# Patient Record
Sex: Male | Born: 1960 | Race: Black or African American | Hispanic: No | Marital: Married | Smoking: Current every day smoker
Health system: Southern US, Community
[De-identification: ages and names within clinical notes are randomized; demographics above are authoritative.]

---

## 2007-03-26 ENCOUNTER — Emergency Department (HOSPITAL_COMMUNITY): Admission: EM | Admit: 2007-03-26 | Discharge: 2007-03-26 | Payer: Self-pay | Admitting: Emergency Medicine

## 2020-03-25 ENCOUNTER — Other Ambulatory Visit: Payer: Self-pay | Admitting: Family Medicine

## 2020-03-25 ENCOUNTER — Ambulatory Visit: Payer: Self-pay

## 2020-03-25 ENCOUNTER — Other Ambulatory Visit: Payer: Self-pay

## 2020-03-25 ENCOUNTER — Ambulatory Visit (HOSPITAL_COMMUNITY)
Admission: EM | Admit: 2020-03-25 | Discharge: 2020-03-25 | Disposition: A | Discharge status: Home / Self Care | Payer: Self-pay

## 2020-03-25 DIAGNOSIS — M25571 Pain in right ankle and joints of right foot: Secondary | ICD-10-CM

## 2020-03-25 NOTE — ED Notes (Signed)
Pt was here for workers comp; pt was sent to occupational health.

## 2021-08-26 ENCOUNTER — Other Ambulatory Visit: Payer: Self-pay

## 2021-08-26 ENCOUNTER — Ambulatory Visit (HOSPITAL_COMMUNITY)
Admission: EM | Admit: 2021-08-26 | Discharge: 2021-08-26 | Disposition: A | Discharge status: Home / Self Care | Payer: PRIVATE HEALTH INSURANCE | Attending: Emergency Medicine | Admitting: Emergency Medicine

## 2021-08-26 ENCOUNTER — Encounter (HOSPITAL_COMMUNITY): Payer: Self-pay | Admitting: Emergency Medicine

## 2021-08-26 ENCOUNTER — Ambulatory Visit (INDEPENDENT_AMBULATORY_CARE_PROVIDER_SITE_OTHER): Payer: PRIVATE HEALTH INSURANCE

## 2021-08-26 ENCOUNTER — Ambulatory Visit (HOSPITAL_COMMUNITY): Payer: PRIVATE HEALTH INSURANCE

## 2021-08-26 DIAGNOSIS — M79671 Pain in right foot: Secondary | ICD-10-CM | POA: Diagnosis not present

## 2021-08-26 MED ORDER — IBUPROFEN 800 MG PO TABS: 800.0000 mg | ORAL_TABLET | Freq: Three times a day (TID) | ORAL | 0 refills | Status: AC

## 2021-08-26 NOTE — ED Triage Notes (Signed)
Pt is present today with right foot pain that started today. Pt states that it is painful to bear weight down on that foot. Pt denies any injury

## 2021-08-26 NOTE — ED Provider Notes (Signed)
[facility]    CSN: [id number] Arrival date & time: 08/26/21  1445      History   Chief Complaint Chief Complaint  Patient presents with   Foot Pain    Right      HPI Scott Roy is a 60 y.o. male.   Patient here for evaluation of right foot pain that started last night.  Reports pain is achy and constant.  Reports pain is worse with movement or when bearing weight.  Does report foot pain and "problems" in the past.  Reports taking Aleve with minimal symptom relief.  Denies any trauma, injury, or other precipitating event.  Denies any fevers, chest pain, shortness of breath, N/V/D, numbness, tingling, weakness, abdominal pain, or headaches.    The history is provided by the patient and a relative. The history is limited by a language barrier.  Foot Pain   History reviewed. No pertinent past medical history.  There are no problems to display for this patient.   History reviewed. No pertinent surgical history.     Home Medications    Prior to Admission medications   Medication Sig Start Date End Date Taking? Authorizing Provider  ibuprofen (ADVIL) 800 MG tablet Take 1 tablet (800 mg total) by mouth 3 (three) times daily. 08/26/21  Yes Ivette Loyal, NP    Family History History reviewed. No pertinent family history.  Social History Social History   Tobacco Use   Smoking status: Every Day    Types: Cigarettes   Smokeless tobacco: Never  Vaping Use   Vaping Use: Never used  Substance Use Topics   Alcohol use: Not Currently   Drug use: Never     Allergies   Patient has no known allergies.   Review of Systems Review of Systems  Musculoskeletal:  Positive for arthralgias and joint swelling.  All other systems reviewed and are negative.   Physical Exam Triage Vital Signs ED Triage Vitals  Enc Vitals Group     BP 08/26/21 1600 (!) 156/92     Pulse Rate 08/26/21 1558 73     Resp 08/26/21 1558 18     Temp 08/26/21 1558 98.2 F (36.8  C)     Temp src --      SpO2 08/26/21 1558 100 %     Weight --      Height --      Head Circumference --      Peak Flow --      Pain Score 08/26/21 1556 10     Pain Loc --      Pain Edu? --      Excl. in GC? --    No data found.  Updated Vital Signs BP (!) 156/92   Pulse 73   Temp 98.2 F (36.8 C)   Resp 18   SpO2 100%   Visual Acuity Right Eye Distance:   Left Eye Distance:   Bilateral Distance:    Right Eye Near:   Left Eye Near:    Bilateral Near:     Physical Exam Vitals and nursing note reviewed.  Constitutional:      General: He is not in acute distress.    Appearance: Normal appearance. He is not ill-appearing, toxic-appearing or diaphoretic.  HENT:     Head: Normocephalic and atraumatic.  Eyes:     Conjunctiva/sclera: Conjunctivae normal.  Cardiovascular:     Rate and Rhythm: Normal rate.     Pulses: Normal pulses.  Pulmonary:  Effort: Pulmonary effort is normal.  Abdominal:     General: Abdomen is flat.  Musculoskeletal:     Cervical back: Normal range of motion.     Right foot: Decreased range of motion (due to pain). Normal capillary refill. Swelling, tenderness and bony tenderness present. No crepitus. Normal pulse.       Feet:  Skin:    General: Skin is warm and dry.  Neurological:     General: No focal deficit present.     Mental Status: He is alert and oriented to person, place, and time.  Psychiatric:        Mood and Affect: Mood normal.     UC Treatments / Results  Labs (all labs ordered are listed, but only abnormal results are displayed) Labs Reviewed - No data to display  EKG   Radiology DG Foot Complete Right  Result Date: 08/26/2021 CLINICAL DATA:  Pain and swelling of the right foot EXAM: RIGHT FOOT COMPLETE - 3+ VIEW COMPARISON:  03/25/2020 ankle radiographs FINDINGS: Chronically fragmented dorsal spurring of the talar head apparent on the lateral projection and not changed from 03/25/2020. Small plantar and  Achilles calcaneal spurs. The fifth toe is flexed during imaging in the fourth toe is mildly flexed during imaging. I do not observe a fracture. There continues to be some mild dorsal spurring at the Lisfranc joint medially. I do not observe a foreign body or abnormal gas in the soft tissues. IMPRESSION: 1. Chronic plantar and Achilles calcaneal spurs. Chronic fragmented spurring of the dorsal navicular. There is also some mild chronic dorsal spurring along the medial Lisfranc joint. 2. If pain persists despite conservative therapy, MRI may be warranted for further characterization. Electronically Signed   By: Gaylyn Rong M.D.   On: 08/26/2021 16:51    Procedures Procedures (including critical care time)  Medications Ordered in UC Medications - No data to display  Initial Impression / Assessment and Plan / UC Course  I have reviewed the triage vital signs and the nursing notes.  Pertinent labs & imaging results that were available during my care of the patient were reviewed by me and considered in my medical decision making (see chart for details).    Assessment negative for red flags or concerns.  X-ray shows plantar and Achilles calcaneal spurs as well as some dorsal spurring along the medial Lisfranc joint.  Otherwise no acute bony abnormalities.  Foot pain likely due to bone spurs.  May take ibuprofen 3 times a day as needed.  May also use Tylenol.  May use a Ace bandage or splint for comfort.  Recommend rest, ice, compression, and elevation.  Follow-up with podiatry as soon as possible for further evaluation. Final Clinical Impressions(s) / UC Diagnoses   Final diagnoses:  Foot pain, right     Discharge Instructions      Take the Ibuprofen 3 times a day as needed for pain.  You can also take Tylenol as needed.    You can use a splint or ace bandage as needed for comfort.   Rest as much as possible Ice for 10-15 minutes every 4-6 hours as needed for pain and  swelling Compression- use an ace bandage or splint for comfort Elevate above your hip when sitting and laying down  Follow up with [facility] for re-evaluation.      ED Prescriptions     Medication Sig Dispense Auth. Provider   ibuprofen (ADVIL) 800 MG tablet Take 1 tablet (800 mg  total) by mouth 3 (three) times daily. 30 tablet Ivette Loyal, NP      PDMP not reviewed this encounter.   Ivette Loyal, NP 08/26/21 1720

## 2021-08-26 NOTE — Discharge Instructions (Signed)
Take the Ibuprofen 3 times a day as needed for pain.  You can also take Tylenol as needed.    You can use a splint or ace bandage as needed for comfort.   Rest as much as possible Ice for 10-15 minutes every 4-6 hours as needed for pain and swelling Compression- use an ace bandage or splint for comfort Elevate above your hip when sitting and laying down  Follow up with [facility] for re-evaluation.

## 2021-09-02 ENCOUNTER — Encounter: Payer: Self-pay | Admitting: Podiatry

## 2021-09-02 ENCOUNTER — Other Ambulatory Visit: Payer: Self-pay

## 2021-09-02 ENCOUNTER — Ambulatory Visit (INDEPENDENT_AMBULATORY_CARE_PROVIDER_SITE_OTHER): Payer: PRIVATE HEALTH INSURANCE | Admitting: Podiatry

## 2021-09-02 DIAGNOSIS — M7671 Peroneal tendinitis, right leg: Secondary | ICD-10-CM

## 2021-09-02 DIAGNOSIS — M722 Plantar fascial fibromatosis: Secondary | ICD-10-CM

## 2021-09-02 NOTE — Patient Instructions (Signed)
Look for Voltaren gel at the pharmacy over the counter or online (also known as diclofenac 1% gel). Apply to the painful areas 3-4x daily with the supplied dosing card. Allow to dry for 10 minutes before going into socks/shoes   Peroneal Tendinopathy Rehab Ask your health care provider which exercises are safe for you. Do exercises exactly as told by your health care provider and adjust them as directed. It is normal to feel mild stretching, pulling, tightness, or discomfort as you do these exercises. Stop right away if you feel sudden pain or your pain gets worse. Do not begin these exercises until told by your health care provider. Stretching and range-of-motion exercises These exercises warm up your muscles and joints and improve the movement and flexibility of your ankle. These exercises also help to relieve pain and stiffness. Gastroc and soleus stretch, standing  This is an exercise in which you stand on a step and use your body weight to stretch your calf muscles. To do this exercise: Stand on the edge of a step on the ball of your left / right foot. The ball of your foot is on the walking surface, right under your toes. Keep your other foot firmly on the same step. Hold on to the wall, a railing, or a chair for balance. Slowly lift your other foot, allowing your body weight to press your left / right heel down over the edge of the step. You should feel a stretch in your left / right calf (gastrocnemius and soleus). Hold this position for 15 seconds. Return both feet to the step. Repeat this exercise with a slight bend in your left / right knee. Repeat 5 times with your left / right knee straight and 5 times with your left / right knee bent. Complete this exercise 2 times a day. Strengthening exercises These exercises build strength and endurance in your foot and ankle. Endurance is the ability to use your muscles for a long time, even after they get tired. Ankle dorsiflexion with  band   Secure a rubber exercise band or tube to an object, such as a table leg, that will not move when the band is pulled. Secure the other end of the band around your left / right foot. Sit on the floor, facing the object with your left / right leg extended. The band or tube should be slightly tense when your foot is relaxed. Slowly flex your left / right ankle and toes to bring your foot toward you (dorsiflexion). Hold this position for 15 seconds. Let the band or tube slowly pull your foot back to the starting position. Repeat 5 times. Complete this exercise 2 times a day. Ankle eversion Sit on the floor with your legs straight out in front of you. Loop a rubber exercise band or tube around the ball of your left / right foot. The ball of your foot is on the walking surface, right under your toes. Hold the ends of the band in your hands, or secure the band to a stable object. The band or tube should be slightly tense when your foot is relaxed. Slowly push your foot outward, away from your other leg (eversion). Hold this position for 15 seconds. Slowly return your foot to the starting position. Repeat 5 times. Complete this exercise 2 times a day. Plantar flexion, standing  This exercise is sometimes called standing heel raise. Stand with your feet shoulder-width apart. Place your hands on a wall or table to steady yourself as   needed, but try not to use it for support. Keep your weight spread evenly over the width of your feet while you slowly rise up on your toes (plantar flexion). If told by your health care provider: Shift your weight toward your left / right leg until you feel challenged. Stand on your left / right leg only. Hold this position for 15 seconds. Repeat 2 times. Complete this exercise 2 times a day. Single leg stand Without shoes, stand near a railing or in a doorway. You may hold on to the railing or door frame as needed. Stand on your left / right foot. Keep your  big toe down on the floor and try to keep your arch lifted. Do not roll to the outside of your foot. If this exercise is too easy, you can try it with your eyes closed or while standing on a pillow. Hold this position for 15 seconds. Repeat 5 times. Complete this exercise 2 times a day. This information is not intended to replace advice given to you by your health care provider. Make sure you discuss any questions you have with your health care provider. Document Revised: 02/21/2019 Document Reviewed: 02/21/2019 Elsevier Patient Education  2020 [employer]  

## 2021-09-03 ENCOUNTER — Ambulatory Visit: Payer: Self-pay | Admitting: Podiatry

## 2021-09-03 ENCOUNTER — Encounter: Payer: Self-pay | Admitting: Podiatry

## 2021-09-03 NOTE — Progress Notes (Signed)
  Subjective:  Patient ID: Scott Roy, male    DOB: [birth year],  MRN: [id number]  Chief Complaint  Patient presents with   Foot Pain      (np) right floot pain-possible bone spur-went to urgent care, had xrays done    60 y.o. male presents with the above complaint. History confirmed with patient.  Worked a very long 12-hour shift last week and then woke up with significant pain in the outside of the ankle and the bottom of the heel.  He stands and walks about 12 hours at a time.  He still has lots of lifting at his job.  He has to wear steel toed shoes  Objective:  Physical Exam: warm, good capillary refill, no trophic changes or ulcerative lesions, normal DP and PT pulses, and normal sensory exam. Left Foot: normal exam, no swelling, tenderness, instability; ligaments intact, full range of motion of all ankle/foot joints Right Foot: point tenderness over the heel pad, point tenderness of the mid plantar fascia, and tenderness to palpation over the peroneal tendons and with resisted eversion  No images are attached to the encounter.  Radiographs: Multiple views x-ray of right ankle: Reviewed images from urgent care no acute osseous abnormalities Assessment:   1. Peroneal tendinitis of right lower extremity   2. Plantar fasciitis of right foot      Plan:  Patient was evaluated and treated and all questions answered.  Discussed the etiology and treatment options for peroneal tendinitis and plantar fasciitis including stretching, formal physical therapy with an eccentric exercises therapy plan, supportive shoegears such as a running shoe or sneaker, bracing, topical and oral medications.  We also discussed that I do not routinely perform injections in this area because of the risk of an increased damage or rupture of the tendon.  We also discussed the role of surgical treatment of this for patients who do not improve after exhausting non-surgical treatment options.  -XR reviewed with  patient -Educated on stretching and icing of the affected limb. -Referral placed to physical therapy.  This is sent to [facility] on [address] -Continue the ibuprofen 8 or milligrams that he has.  He will let me know if he needs refills.  Discussed the risks and complications from taking this. -With the type of work he has he will likely need light duty for approximately 6 weeks while this rehabs.  I wrote a letter to this effect -Bracing support with a Tri-Lock ankle brace recommended and I dispensed this today  Return in about 8 weeks (around 10/28/2021) for re-check peroneal tendinitis, recheck plantar fasciitis.

## 2021-10-23 ENCOUNTER — Encounter: Payer: Self-pay | Admitting: Podiatry

## 2021-10-28 ENCOUNTER — Ambulatory Visit (INDEPENDENT_AMBULATORY_CARE_PROVIDER_SITE_OTHER): Payer: Self-pay | Admitting: Podiatry

## 2021-10-28 DIAGNOSIS — Z91199 Patient's noncompliance with other medical treatment and regimen due to unspecified reason: Secondary | ICD-10-CM

## 2021-10-28 NOTE — Progress Notes (Signed)
Patient was no-show for appointment today 

## 2022-05-13 IMAGING — DX DG FOOT COMPLETE 3+V*R*
3 series · 3 of 3 positions shown · non-contrast
Comparison: 03/25/2020 ankle radiographs

CLINICAL DATA: Pain and swelling of the right foot

EXAM:
RIGHT FOOT COMPLETE - 3+ VIEW

[foot ap]
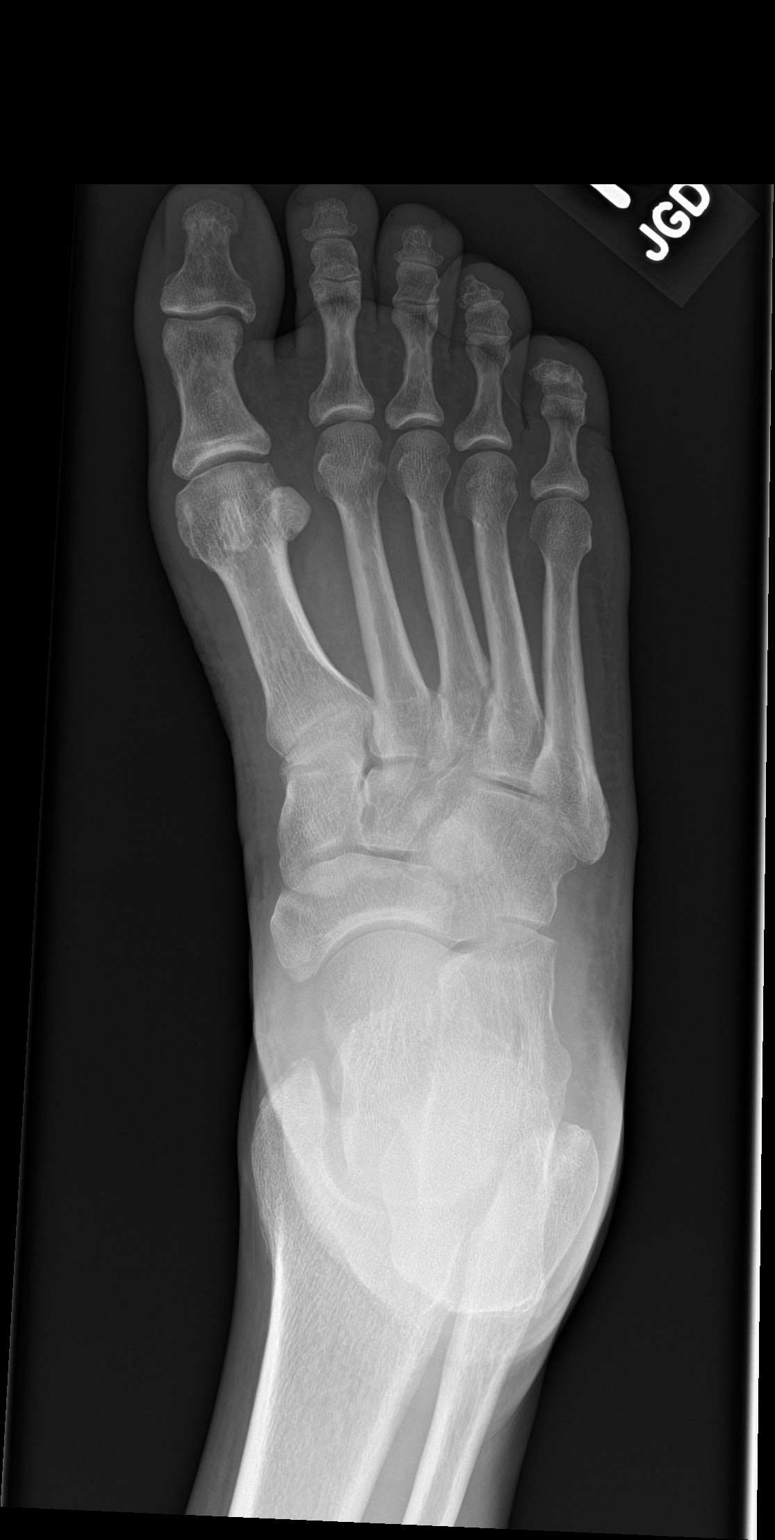

[foot obl]
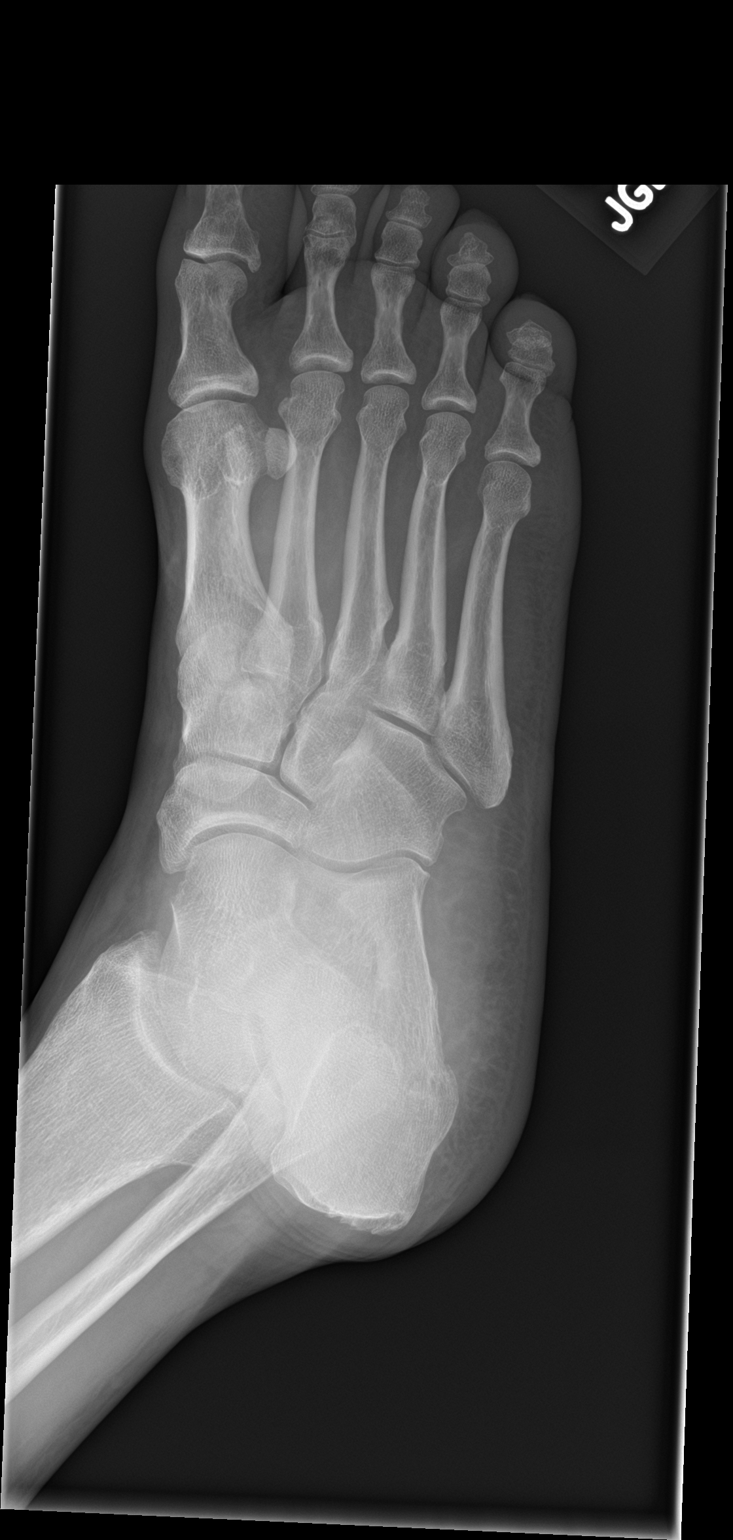

[foot lat]
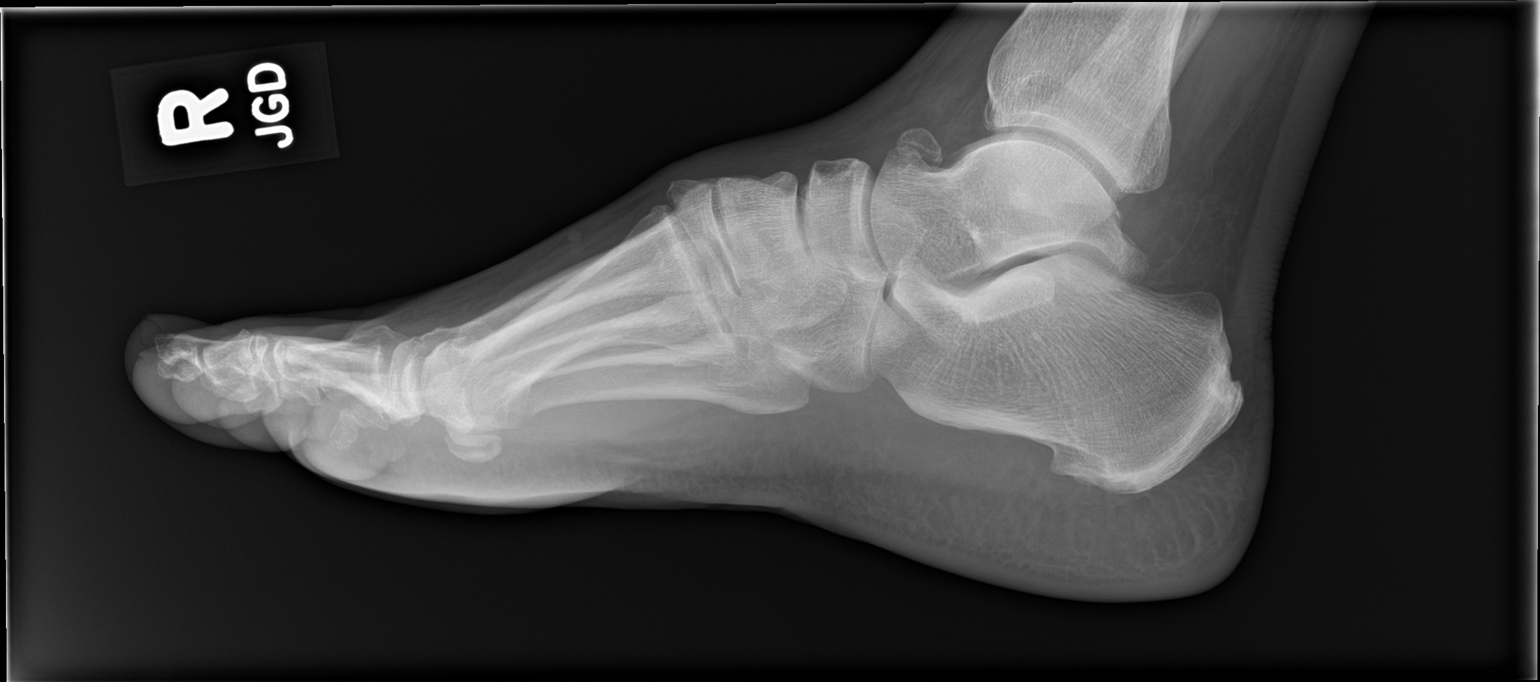

[3 of 3 positions shown; findings below may reference images not displayed]

FINDINGS: Chronically fragmented dorsal spurring of the talar head apparent on
the lateral projection and not changed from 03/25/2020. Small
plantar and Achilles calcaneal spurs. The fifth toe is flexed during
imaging in the fourth toe is mildly flexed during imaging. I do not
observe a fracture. There continues to be some mild dorsal spurring
at the Lisfranc joint medially. I do not observe a foreign body or
abnormal gas in the soft tissues.
IMPRESSION: 1. Chronic plantar and Achilles calcaneal spurs. Chronic fragmented
spurring of the dorsal navicular. There is also some mild chronic
dorsal spurring along the medial Lisfranc joint.
2. If pain persists despite conservative therapy, MRI may be
warranted for further characterization.
